# Patient Record
Sex: Male | Born: 1980 | Race: White | Hispanic: No | Marital: Married
Health system: Southern US, Community
[De-identification: ages and names within clinical notes are randomized; demographics above are authoritative.]

---

## 2013-08-16 ENCOUNTER — Other Ambulatory Visit: Payer: Self-pay | Admitting: Family Medicine

## 2013-08-16 ENCOUNTER — Ambulatory Visit (INDEPENDENT_AMBULATORY_CARE_PROVIDER_SITE_OTHER): Payer: Self-pay

## 2013-08-16 DIAGNOSIS — R52 Pain, unspecified: Secondary | ICD-10-CM

## 2013-08-16 DIAGNOSIS — M25569 Pain in unspecified knee: Secondary | ICD-10-CM

## 2013-12-11 ENCOUNTER — Other Ambulatory Visit: Payer: Self-pay | Admitting: Family Medicine

## 2013-12-11 ENCOUNTER — Ambulatory Visit (INDEPENDENT_AMBULATORY_CARE_PROVIDER_SITE_OTHER): Payer: Self-pay

## 2013-12-11 DIAGNOSIS — R52 Pain, unspecified: Secondary | ICD-10-CM

## 2013-12-11 DIAGNOSIS — M47817 Spondylosis without myelopathy or radiculopathy, lumbosacral region: Secondary | ICD-10-CM

## 2013-12-11 DIAGNOSIS — M810 Age-related osteoporosis without current pathological fracture: Secondary | ICD-10-CM

## 2014-03-20 ENCOUNTER — Ambulatory Visit (INDEPENDENT_AMBULATORY_CARE_PROVIDER_SITE_OTHER): Payer: Self-pay

## 2014-03-20 ENCOUNTER — Other Ambulatory Visit: Payer: Self-pay | Admitting: Adult Health

## 2014-03-20 DIAGNOSIS — R52 Pain, unspecified: Secondary | ICD-10-CM

## 2014-10-29 IMAGING — CR DG LUMBAR SPINE COMPLETE 4+V
5 series · 5 of 5 positions shown · non-contrast
Comparison: None.

CLINICAL DATA: Injured back.  Right lateral and lower back pain.

EXAM:
LUMBAR SPINE - COMPLETE 4+ VIEW

[view not recorded (1 of 5)]
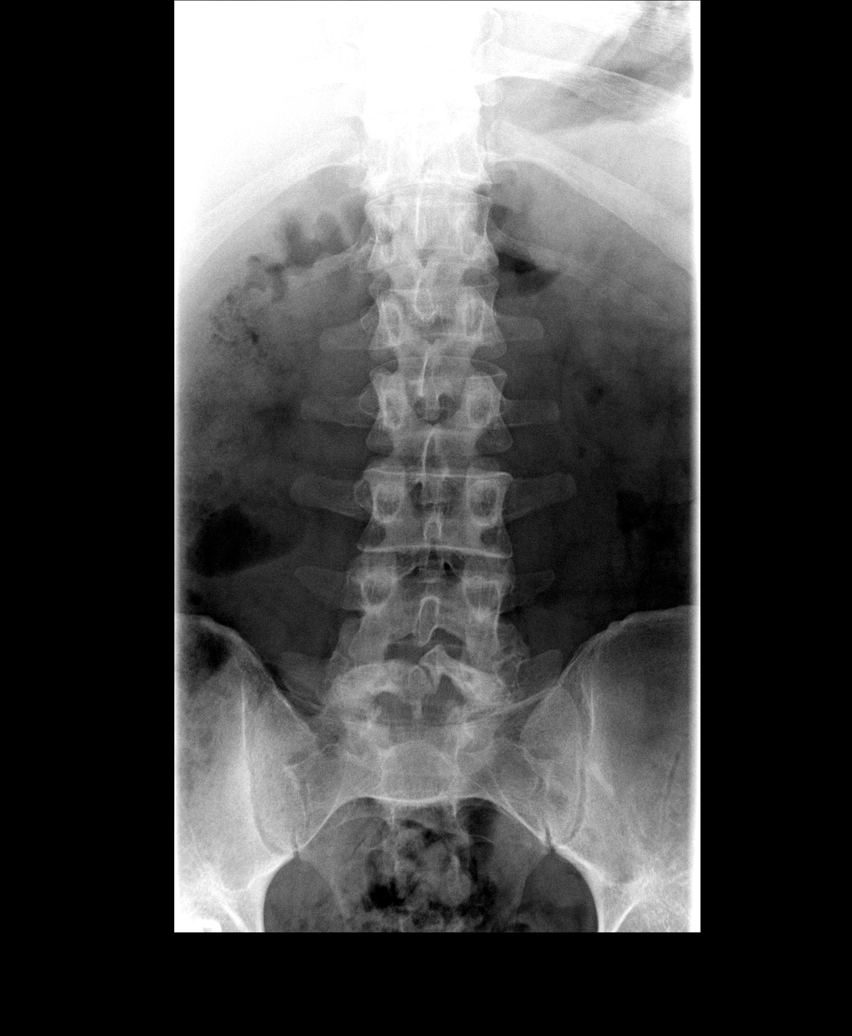

[view not recorded (2 of 5)]
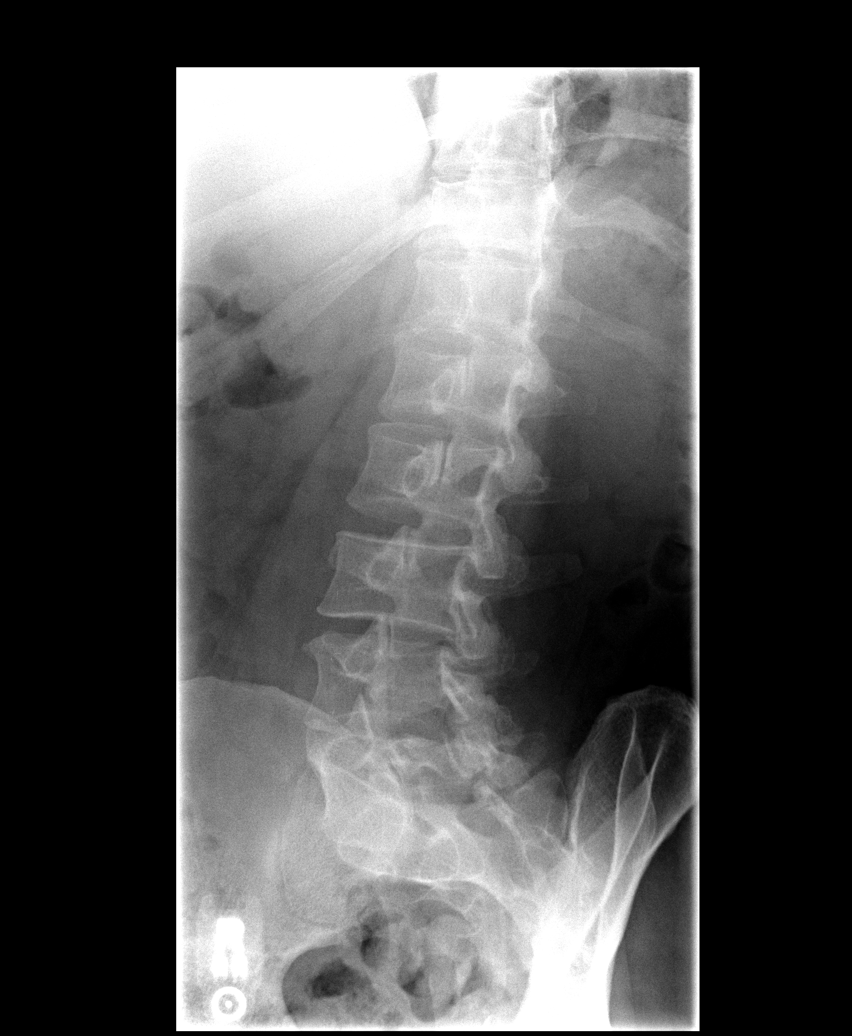

[view not recorded (3 of 5)]
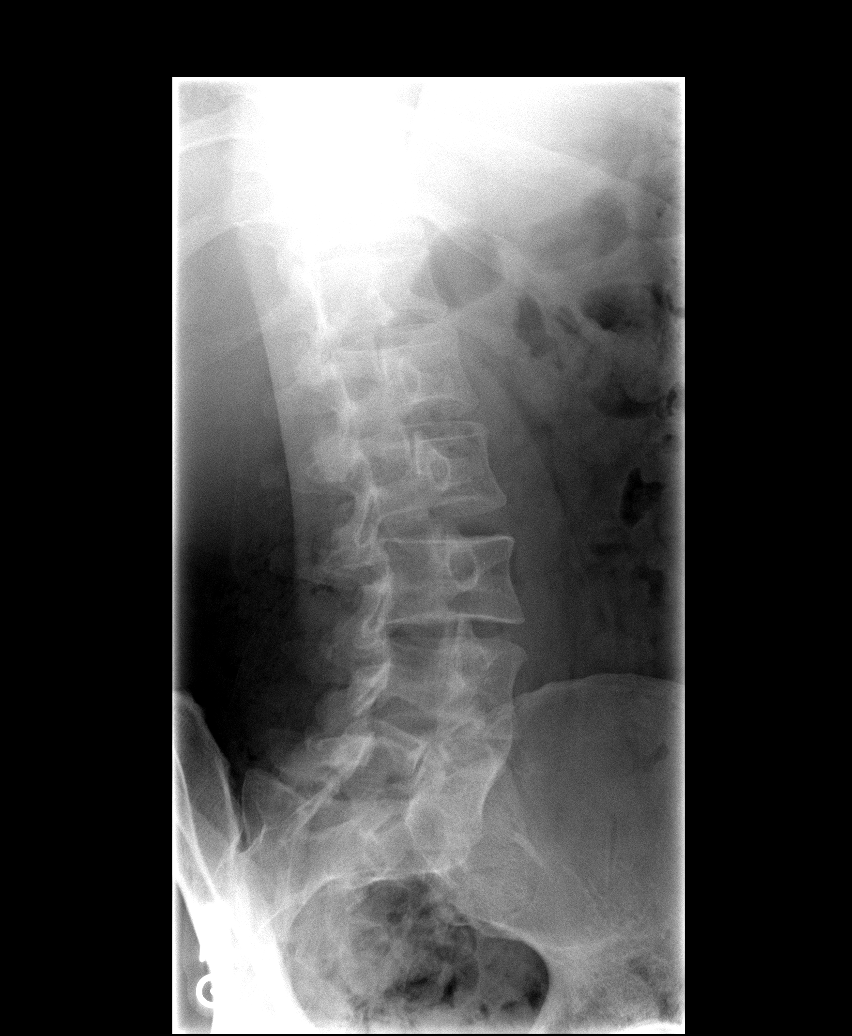

[view not recorded (4 of 5)]
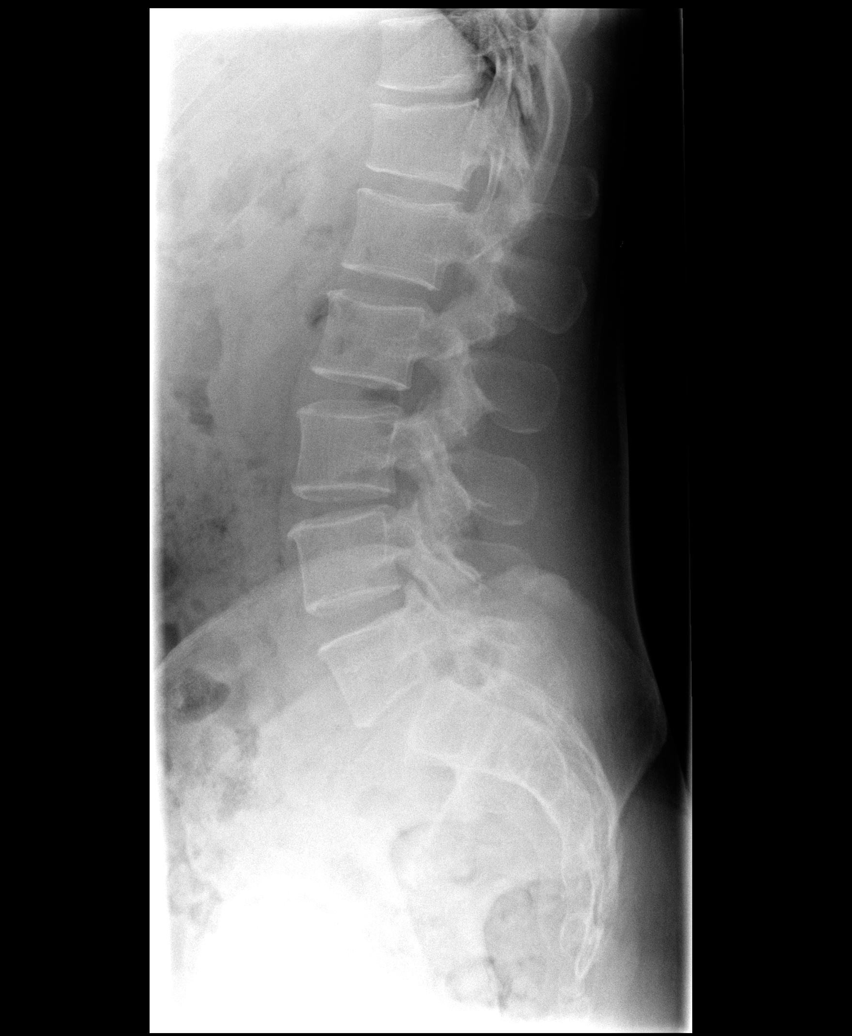

[view not recorded (5 of 5)]
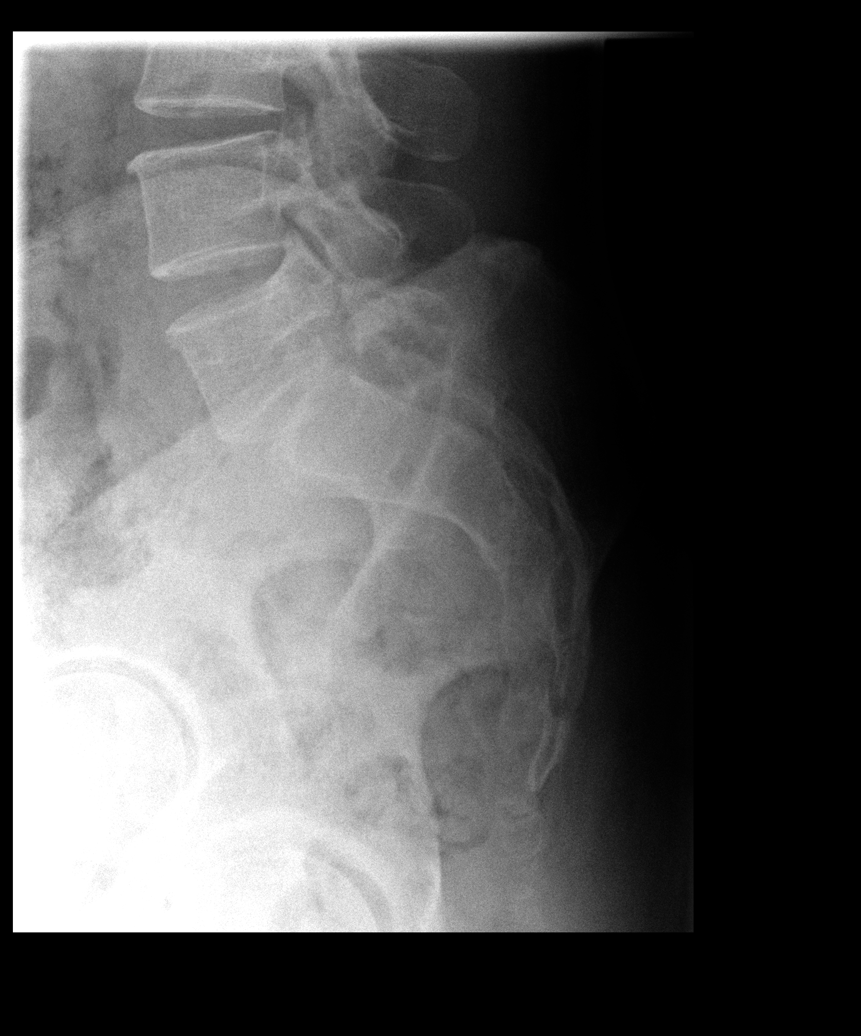

[5 of 5 positions shown; findings below may reference images not displayed]

FINDINGS: Alignment is normal. No suspicious lytic or blastic lesions are
identified. There is mild anterior wedging of T12, possibly
physiologic. Suspect bilateral pars defects at L5. No associated
spondylolisthesis. Mild spondylosis identified at L3-4.
IMPRESSION: 1.  No evidence for acute lumbar level abnormality.
2. Anterior wedging at T12, with possibly physiologic. Correlation
is recommended with symptoms regarding possibility that this could
represent an acute abnormality.
3. Suspect bilateral pars defects at L5.
